# Patient Record
Sex: Female | Born: 1995
Health system: Southern US, Community
[De-identification: ages and names within clinical notes are randomized; demographics above are authoritative.]

---
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
## 2016-11-21 DIAGNOSIS — Z800 Family history of malignant neoplasm of digestive organs: Secondary | ICD-10-CM | POA: Diagnosis not present

## 2016-11-21 DIAGNOSIS — Z803 Family history of malignant neoplasm of breast: Secondary | ICD-10-CM | POA: Diagnosis not present

## 2016-12-31 DIAGNOSIS — M9903 Segmental and somatic dysfunction of lumbar region: Secondary | ICD-10-CM | POA: Diagnosis not present

## 2016-12-31 DIAGNOSIS — M9901 Segmental and somatic dysfunction of cervical region: Secondary | ICD-10-CM | POA: Diagnosis not present

## 2016-12-31 DIAGNOSIS — M9902 Segmental and somatic dysfunction of thoracic region: Secondary | ICD-10-CM | POA: Diagnosis not present

## 2016-12-31 DIAGNOSIS — M40202 Unspecified kyphosis, cervical region: Secondary | ICD-10-CM | POA: Diagnosis not present

## 2017-02-11 DIAGNOSIS — H5203 Hypermetropia, bilateral: Secondary | ICD-10-CM | POA: Diagnosis not present

## 2017-02-27 DIAGNOSIS — F332 Major depressive disorder, recurrent severe without psychotic features: Secondary | ICD-10-CM | POA: Diagnosis not present

## 2017-02-27 DIAGNOSIS — Z8639 Personal history of other endocrine, nutritional and metabolic disease: Secondary | ICD-10-CM | POA: Diagnosis not present

## 2017-02-27 DIAGNOSIS — R5383 Other fatigue: Secondary | ICD-10-CM | POA: Diagnosis not present

## 2017-06-30 DIAGNOSIS — L709 Acne, unspecified: Secondary | ICD-10-CM | POA: Diagnosis not present

## 2017-06-30 DIAGNOSIS — N644 Mastodynia: Secondary | ICD-10-CM | POA: Diagnosis not present

## 2017-06-30 DIAGNOSIS — N6029 Fibroadenosis of unspecified breast: Secondary | ICD-10-CM | POA: Diagnosis not present

## 2017-06-30 DIAGNOSIS — Z975 Presence of (intrauterine) contraceptive device: Secondary | ICD-10-CM | POA: Diagnosis not present

## 2017-08-21 DIAGNOSIS — D485 Neoplasm of uncertain behavior of skin: Secondary | ICD-10-CM | POA: Diagnosis not present

## 2017-08-21 MED FILL — MINOCYCLINE 100 MG CAPSULE: 100 | 30 days supply | Qty: 60 | Fill #0

## 2017-09-29 ENCOUNTER — Encounter: Payer: Self-pay | Admitting: Internal Medicine

## 2017-09-29 ENCOUNTER — Ambulatory Visit (INDEPENDENT_AMBULATORY_CARE_PROVIDER_SITE_OTHER): Payer: 59 | Admitting: Internal Medicine

## 2017-09-29 DIAGNOSIS — S60352A Superficial foreign body of left thumb, initial encounter: Secondary | ICD-10-CM | POA: Diagnosis not present

## 2017-09-29 DIAGNOSIS — Z23 Encounter for immunization: Secondary | ICD-10-CM | POA: Diagnosis not present

## 2017-09-29 NOTE — Progress Notes (Signed)
   Subjective:  Patient ID: Carly Young, female    DOB: 07-Jan-1996  Age: 22 y.o. 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  This occurred seconds ago here at work.  He complains of mild pain.  No outpatient medications prior to visit.   No facility-administered medications prior to visit.     ROS Review of Systems  All other systems reviewed and are negative.   Objective:  There were no vitals taken for this visit.  BP Readings from Last 3 Encounters:  No data found for BP    Wt Readings from Last 3 Encounters:  No data found for Wt    Physical Exam  Constitutional:  Tearful female  Musculoskeletal:       Left hand: She exhibits deformity. She exhibits normal range of motion, no tenderness, normal capillary refill, no laceration and no swelling. Normal sensation noted. Normal strength noted.       Hands:   No results found for: WBC, HGB, HCT, PLT, GLUCOSE, CHOL, TRIG, HDL, LDLDIRECT, LDLCALC, ALT, AST, NA, K, CL, CREATININE, BUN, CO2, TSH, PSA, INR, GLUF, HGBA1C, MICROALBUR  Dg Chest 2 View  Result Date: 08/05/2005 Clinical Data:  42-year-old, with fever for five days. 2-VIEW CHEST: Findings:  Cardiac silhouette, mediastinal, and hilar contours are within normal limits.  There is a vague opacity on the frontal film in the left lung which on the lateral film appears to be an infiltrate with some atelectasis in the left lingular region.  It is possible it could be loculated fluid in a fissure but I think that is much less likely particularly given her symptoms. IMPRESSION:  Left lingular infiltrate.  I would recommend a posttreatment followup chest film to make sure this resolves. Provider: Bryson Corona   Assessment & Plan:   Honestii was seen today for hand pain.  Diagnoses and all orders for this visit:  Foreign body of left thumb, initial encounter- The staple was easily and  thoroughly removed with no complications.  She will observe the area for signs of wound infection and if symptoms develop she will promptly let me know and I will start antibiotics but at this time I consider this to be a clean wound and I do not think phylactic antibiotics are warranted.  She was given a tetanus booster.  Other orders -     Tdap vaccine greater than or equal to 7yo IM   Carly Young does not currently have medications on file.  No orders of the defined types were placed in this encounter.    Follow-up: Return in about 1 day (around 09/30/2017).  Scarlette Calico, MD

## 2017-09-29 NOTE — Patient Instructions (Signed)
Hand or Foot Foreign Body, Adult  A foreign body is an object that gets into your body that should not be there. Your hands and feet are common entry points for foreign bodies. Common examples of foreign bodies include:   Splinters.   Thorns.   Pieces of glass, wood, metal, or plastic.   Metal objects such as needles, nails, and fishing hooks.    Foreign bodies that are not removed quickly may cause infection.  What are the causes?  A foreign body can get in your hand or foot through an injury, such as a scrape or cut, or if something punctures your skin.  What are the signs or symptoms?  Symptoms of having recently gotten a foreign body in your hand or foot include:   Pain.   Redness.   Swelling.   Tenderness.   Numbness or tingling.   Noticing something under the skin.    If the foreign body has caused an infection, symptoms may also include:   Fever.   Warmth in the area of the puncture.   A lump that you can feel under the skin.   Not being able to use your hand or put weight (bear weight) on your foot.   Greenish or yellow fluid coming from the puncture area.    How is this diagnosed?  Your health care provider will diagnose a foreign body in the hand or foot by taking a medical history and examining your wound. If the foreign body is deep, you may also have tests, such as an X-ray, MRI, or ultrasound.  In some cases, your health care provider may need to numb the area, open it up to examine what is inside your skin, and remove the object.  How is this treated?  Your health care provider may be able to remove the foreign body while exploring your wound during the diagnosis. If the foreign body is deep or in a wound that is infected, you may need to have a procedure to remove it.  If an incision is needed, it may be closed with stitches (sutures), skin glue, or adhesive strips. A bandage (dressing) will be placed over the incision.  You may also need to get a tetanus shot or take antibiotic  medicines to prevent or treat an infection.  Follow these instructions at home:  Medicines   Take over-the-counter and prescription medicines only as told by your health care provider.   If you were prescribed an antibiotic medicine, take the antibiotic as told by your health care provider. Do not stop taking the antibiotic even if you start to feel better.  Wound care   Change your dressing as told by your health care provider.   Follow instructions from your health care provider about how to take care of your wound or incision. Make sure you:  ? Wash your hands with soap and water before you change your dressing. If soap and water are not available, use hand sanitizer.  ? Change your dressing as told by your health care provider.  ? Leave sutures, skin glue, or adhesive strips in place. These skin closures may need to stay in place for 2 weeks or longer. If adhesive strip edges start to loosen and curl up, you may trim the loose edges. Do not remove adhesive strips completely unless your health care provider tells you to do that.   Check your puncture area or incision every day for signs of infection. Check for:  ? More redness,   swelling, or pain.  ? More fluid or blood.  ? Warmth.  ? Pus or a bad smell.  General instructions   Return to your normal activities as told by your health care provider. Ask your health care provider what activities are safe for you.   Do not take baths, swim, or use a hot tub until your health care provider approves. Ask your health care provider if you may take showers. You may only be allowed to take sponge baths.   Keep all follow-up visits as told by your health care provider. This is important.  How is this prevented?  To protect yourself:   Wear gloves when working with sharp objects.   Do not walk barefoot in areas where there may be sharp objects.   Wear thick-soled shoes or boots when walking in areas where there are sharp objects.    Contact a health care provider  if:   You have a foreign body that has not come out or been removed.   You have more redness, swelling, or pain around the puncture area or incision.   You have more fluid or blood coming from the puncture area or incision.   Your puncture area or incision feels warm to the touch.   You have pus or a bad smell coming from the puncture area or incision.   You have a fever.   You were given a tetanus shot, and you have swelling, severe pain, redness, or bleeding at the injection site.  Get help right away if:   Your puncture area becomes numb.   You cannot use your hand or foot.  Summary   A foreign body is an object that gets into your body that should not be there. Your hands and feet are common entry points for foreign bodies.   Your health care provider may be able to remove the foreign body while exploring your wound during the diagnosis. If the foreign body is deep or in a wound that is infected, you may need to have a procedure to remove it.   You may also need to get a tetanus shot or take antibiotic medicines to prevent or treat an infection.   Check your puncture area or incision every day for signs of infection.  This information is not intended to replace advice given to you by your health care provider. Make sure you discuss any questions you have with your health care provider.  Document Released: 10/30/2016 Document Revised: 10/30/2016 Document Reviewed: 10/30/2016  Elsevier Interactive Patient Education  2018 [employer]

## 2017-10-06 DIAGNOSIS — L709 Acne, unspecified: Secondary | ICD-10-CM | POA: Diagnosis not present

## 2017-11-12 ENCOUNTER — Other Ambulatory Visit (INDEPENDENT_AMBULATORY_CARE_PROVIDER_SITE_OTHER): Payer: 59

## 2017-11-12 ENCOUNTER — Other Ambulatory Visit: Payer: Self-pay | Admitting: Family

## 2017-11-12 DIAGNOSIS — N926 Irregular menstruation, unspecified: Secondary | ICD-10-CM

## 2017-11-12 LAB — HCG, QUANTITATIVE, PREGNANCY: Quantitative HCG: 0.14 m[IU]/mL

## 2017-12-11 DIAGNOSIS — Z0000 Encounter for general adult medical examination without abnormal findings: Secondary | ICD-10-CM | POA: Diagnosis not present

## 2017-12-11 DIAGNOSIS — Z6829 Body mass index (BMI) 29.0-29.9, adult: Secondary | ICD-10-CM | POA: Diagnosis not present

## 2018-03-10 NOTE — Progress Notes (Signed)
[name] [address], [place] [address] Phone: [phone] Subjective:    I'm seeing this patient by the request  of:    CC: Neck and back pain  [id number]  Carly Young is a 22 y.o. female coming in with complaint of back, neck, and bilateral hip pain.   Patient has been having pain between scapula. Has scoliosis and has done physical therapy. Patient has constant pain. Has tried ergonomic adjustments. Also has pain on the right cervical. Notes increased tightness as compared bilaterally. Denies any radiating symptoms.   Pain in the hip flexors. Is able to pop the joint to relieve her pain. Pain is intermittent. No specific pattern to her pain. Has a hard time getting comfortable at night and feels that this contributes to her hip pain. Denies any radiating symptoms. Has seen a chripractor for adjustments that did help.   Does try to stretch if she gets tight. Does note that it helps when she performs stretches. Is not currently performing any core exercises.   Patient brought in x-rays.  Patient's x-rays were independently visualized by me.  X-rays taking back in May 2018 shows the patient does have cervical ribs noted bilaterally.  Otherwise cervical neck x-rays unremarkable.  Lower back x-ray show the patient does have some mild narrowing between the L5-S1 but otherwise unremarkable.    No past medical history on file. No past surgical history on file. Social History   Socioeconomic History  . Marital status: Single    Spouse name: Not on file  . Number of children: Not on file  . Years of education: Not on file  . Highest education level: Not on file  Occupational History  . Not on file  Social Needs  . Financial resource strain: Not on file  . Food insecurity:    Worry: Not on file    Inability: Not on file  . Transportation needs:    Medical: Not on file    Non-medical: Not on file  Tobacco Use  . Smoking status:  Not on file  Substance and Sexual Activity  . Alcohol use: Not on file  . Drug use: Not on file  . Sexual activity: Not on file  Lifestyle  . Physical activity:    Days per week: Not on file    Minutes per session: Not on file  . Stress: Not on file  Relationships  . Social connections:    Talks on phone: Not on file    Gets together: Not on file    Attends religious service: Not on file    Active member of club or organization: Not on file    Attends meetings of clubs or organizations: Not on file    Relationship status: Not on file  Other Topics Concern  . Not on file  Social History Narrative  . Not on file   Not on File No family history on file.  No family history of autoimmune   Past medical history, social, surgical and family history all reviewed in electronic medical record.  No pertanent information unless stated regarding to the chief complaint.   Review of Systems:Review of systems updated and as accurate as of 03/12/18  No headache, visual changes, nausea, vomiting, diarrhea, constipation, dizziness, abdominal pain, skin rash, fevers, chills, night sweats, weight loss, swollen lymph nodes, body aches, joint swelling, chest pain, shortness of breath, mood changes.  Positive muscle aches  Objective  Blood pressure 110/80, pulse  63, height 5\' 7"  (1.702 m), SpO2 98 %. Systems examined below as of 03/12/18   General: No apparent distress alert and oriented x3 mood and affect normal, dressed appropriately.  HEENT: Pupils equal, extraocular movements intact  Respiratory: Patient's speak in full sentences and does not appear short of breath  Cardiovascular: No lower extremity edema, non tender, no erythema  Skin: Warm dry intact with no signs of infection or rash on extremities or on axial skeleton.  Abdomen: Soft nontender  Neuro: Cranial nerves II through XII are intact, neurovascularly intact in all extremities with 2+ DTRs and 2+ pulses.  Lymph: No lymphadenopathy  of posterior or anterior cervical chain or axillae bilaterally.  Gait normal with good balance and coordination.  MSK: Mild tender with full range of motion and good stability and symmetric strength and tone of shoulders, elbows, wrist, hip, knee and ankles bilaterally.  Neck: Inspection loss of lordosis. No palpable stepoffs. Negative Spurling's maneuver. Significant decrease in extension sidebending and rotation bilaterally.  Significant muscle tightness compared to what would be expected Grip strength and sensation normal in bilateral hands Strength good C4 to T1 distribution No sensory change to C4 to T1 Negative Hoffman sign bilaterally Reflexes normal Moderate severe tightness of the trapezius bilaterally  Back Exam:  Inspection: Loss of lordosis very minimal scoliosis Motion: Flexion 45 deg, Extension 25 deg, Side Bending to 35 deg bilaterally,  Rotation to 30 deg bilaterally  SLR laying: Negative  XSLR laying: Negative  Palpable tenderness: Tender to palpation the paraspinal musculature lumbar spine right greater than left. FABER: Tightness bilaterally. Sensory change: Gross sensation intact to all lumbar and sacral dermatomes.  Reflexes: 2+ at both patellar tendons, 2+ at achilles tendons, Babinski's downgoing.  Strength at foot  Plantar-flexion: 5/5 Dorsi-flexion: 5/5 Eversion: 5/5 Inversion: 5/5  Leg strength  Quad: 5/5 Hamstring: 5/5 Hip flexor: 5/5 Hip abductors: 5/5  Gait unremarkable.  Osteopathic findings C4 flexed rotated and side bent left C6 flexed rotated and side bent left T9 extended rotated and side bent left L2 flexed rotated and side bent right Sacrum right on right    Impression and Recommendations:     This case required medical decision making of moderate complexity.      Note: This dictation was prepared with Dragon dictation along with smaller phrase technology. Any transcriptional errors that result from this process are unintentional.

## 2018-03-12 ENCOUNTER — Ambulatory Visit (INDEPENDENT_AMBULATORY_CARE_PROVIDER_SITE_OTHER): Payer: 59 | Admitting: Family Medicine

## 2018-03-12 ENCOUNTER — Other Ambulatory Visit (INDEPENDENT_AMBULATORY_CARE_PROVIDER_SITE_OTHER): Payer: 59

## 2018-03-12 ENCOUNTER — Encounter: Payer: Self-pay | Admitting: Family Medicine

## 2018-03-12 VITALS — BP 110/80 | HR 63 | Ht 67.0 in

## 2018-03-12 DIAGNOSIS — M999 Biomechanical lesion, unspecified: Secondary | ICD-10-CM

## 2018-03-12 DIAGNOSIS — M2550 Pain in unspecified joint: Secondary | ICD-10-CM

## 2018-03-12 DIAGNOSIS — Z8739 Personal history of other diseases of the musculoskeletal system and connective tissue: Secondary | ICD-10-CM | POA: Diagnosis not present

## 2018-03-12 LAB — SEDIMENTATION RATE: SED RATE: 17 mm/h (ref 0–20)

## 2018-03-12 LAB — URIC ACID: URIC ACID, SERUM: 4.7 mg/dL (ref 2.4–7.0)

## 2018-03-12 LAB — TSH: TSH: 0.78 u[IU]/mL (ref 0.35–4.50)

## 2018-03-12 LAB — FERRITIN: FERRITIN: 10.7 ng/mL (ref 10.0–291.0)

## 2018-03-12 LAB — VITAMIN D 25 HYDROXY (VIT D DEFICIENCY, FRACTURES): VITD: 17.86 ng/mL — AB (ref 30.00–100.00)

## 2018-03-12 MED ORDER — VITAMIN D (ERGOCALCIFEROL) 1.25 MG (50000 UNIT) PO CAPS: 50000.0000 [IU] | ORAL_CAPSULE | ORAL | 0 refills | Status: AC

## 2018-03-12 MED FILL — VIT D2 1.25 MG (50,000 UNIT: 1.25 MG | 84 days supply | Qty: 12 | Fill #0

## 2018-03-12 NOTE — Assessment & Plan Note (Signed)
Patient has more pain to palpation than what would be anticipated.  Patient does have even some swelling.  Decreased range of motion of multiple joints.  Discussed with patient in great length and will get laboratory work-up.  Attempted osteopathic manipulation with varying degrees of success.  Discussed posture and ergonomics given home exercises for scapular dyskinesis.  Follow-up again in 4 to 8 weeks

## 2018-03-12 NOTE — Assessment & Plan Note (Signed)
Decision today to treat with OMT was based on Physical Exam  After verbal consent patient was treated with HVLA, ME, FPR techniques in cervical, thoracic, lumbar and sacral areas  Patient tolerated the procedure well with improvement in symptoms  Patient given exercises, stretches and lifestyle modifications  See medications in patient instructions if given  Patient will follow up in 4-8 weeks 

## 2018-03-12 NOTE — Patient Instructions (Addendum)
A little different Exercises 3 times a week.  Labs downstairs .[id number] Tennis ball between shoulder blades with sitting Once weekly vitamin D for 12 weeks Turmeric 500mg  daily  Choline 500mg  daily

## 2018-03-16 LAB — ANGIOTENSIN CONVERTING ENZYME: Angiotensin-Converting Enzyme: 41 U/L (ref 9–67)

## 2018-03-16 LAB — ANA: Anti Nuclear Antibody(ANA): NEGATIVE

## 2018-03-16 LAB — RHEUMATOID FACTOR: Rhuematoid fact SerPl-aCnc: <14 IU/mL (ref <14)

## 2018-04-23 ENCOUNTER — Ambulatory Visit (INDEPENDENT_AMBULATORY_CARE_PROVIDER_SITE_OTHER): Payer: 59 | Admitting: Family Medicine

## 2018-04-23 ENCOUNTER — Encounter: Payer: Self-pay | Admitting: Family Medicine

## 2018-04-23 VITALS — BP 100/70 | HR 70 | Ht 67.0 in

## 2018-04-23 DIAGNOSIS — M999 Biomechanical lesion, unspecified: Secondary | ICD-10-CM

## 2018-04-23 DIAGNOSIS — R51 Headache: Secondary | ICD-10-CM | POA: Diagnosis not present

## 2018-04-23 DIAGNOSIS — G4486 Cervicogenic headache: Secondary | ICD-10-CM

## 2018-04-23 NOTE — Patient Instructions (Signed)
Good to see you  Alvera Singh is your friend.  Keep it up! Stay active See me again in 12 weeks but you know what to do

## 2018-04-23 NOTE — Assessment & Plan Note (Signed)
Decision today to treat with OMT was based on Physical Exam  After verbal consent patient was treated with HVLA, ME, FPR techniques in cervical, thoracic, lumbar and sacral areas  Patient tolerated the procedure well with improvement in symptoms  Patient given exercises, stretches and lifestyle modifications  See medications in patient instructions if given  Patient will follow up in 6 weeks 

## 2018-04-23 NOTE — Progress Notes (Signed)
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   CC: Back pain follow-up  [id number]  Carly Young is a 22 y.o. female coming in with complaint of back pain. Pain is doing better. Has been taking meds prescribed. Massage recently and was told she has never damage in his neck.  Patient states that overall though doing some of the different exercises in the anti-inflammatories have been beneficial.  Noticing less pain in severity overall.  Still notices tightness in the neck causing headaches.     No past medical history on file. No past surgical history on file. Social History   Socioeconomic History  . Marital status: Single    Spouse name: Not on file  . Number of children: Not on file  . Years of education: Not on file  . Highest education level: Not on file  Occupational History  . Not on file  Social Needs  . Financial resource strain: Not on file  . Food insecurity:    Worry: Not on file    Inability: Not on file  . Transportation needs:    Medical: Not on file    Non-medical: Not on file  Tobacco Use  . Smoking status: Not on file  Substance and Sexual Activity  . Alcohol use: Not on file  . Drug use: Not on file  . Sexual activity: Not on file  Lifestyle  . Physical activity:    Days per week: Not on file    Minutes per session: Not on file  . Stress: Not on file  Relationships  . Social connections:    Talks on phone: Not on file    Gets together: Not on file    Attends religious service: Not on file    Active member of club or organization: Not on file    Attends meetings of clubs or organizations: Not on file    Relationship status: Not on file  Other Topics Concern  . Not on file  Social History Narrative  . Not on file   Not on File No family history on file.       Current Outpatient  Medications (Other):  Marland Kitchen  Vitamin D, Ergocalciferol, (DRISDOL) 50000 units CAPS capsule, Take 1 capsule (50,000 Units total) by mouth every 7 (seven) days.    Past medical history, social, surgical and family history all reviewed in electronic medical record.  No pertanent information unless stated regarding to the chief complaint.   Review of Systems:  No headache, visual changes, nausea, vomiting, diarrhea, constipation, dizziness, abdominal pain, skin rash, fevers, chills, night sweats, weight loss, swollen lymph nodes, body aches, joint swelling, chest pain, shortness of breath, mood changes.  Positive muscle aches  Objective  Blood pressure 100/70, pulse 70, height 5\' 7"  (1.702 m), SpO2 98 %.    General: No apparent distress alert and oriented x3 mood and affect normal, dressed appropriately.  HEENT: Pupils equal, extraocular movements intact  Respiratory: Patient's speak in full sentences and does not appear short of breath  Cardiovascular: No lower extremity edema, non tender, no erythema  Skin: Warm dry intact with no signs of infection or rash on extremities or on axial skeleton.  Abdomen: Soft nontender  Neuro: Cranial nerves II through XII are intact, neurovascularly intact in all extremities with 2+ DTRs and 2+ pulses.  Lymph: No lymphadenopathy  of posterior or anterior cervical chain or axillae bilaterally.  Gait normal with good balance and coordination.  MSK:  Non tender with full range of motion and good stability and symmetric strength and tone of shoulders, elbows, wrist, hip, knee and ankles bilaterally.  Neck: Inspection loss of lordosis. No palpable stepoffs. Negative Spurling's maneuver. Mild limitation in extension Grip strength and sensation normal in bilateral hands Strength good C4 to T1 distribution No sensory change to C4 to T1 Negative Hoffman sign bilaterally Reflexes normal  Back Exam:  Inspection: Loss of lordosis Motion: Flexion 45 deg, Extension  25 deg, Side Bending to 45 deg bilaterally, Rotation to 35 deg bilaterally  SLR laying: Negative  XSLR laying: Negative  Palpable tenderness: Tender to palpation in the paraspinal musculature. FABER: Tightness. Sensory change: Gross sensation intact to all lumbar and sacral dermatomes.  Reflexes: 2+ at both patellar tendons, 2+ at achilles tendons, Babinski's downgoing.  Strength at foot  Plantar-flexion: 5/5 Dorsi-flexion: 5/5 Eversion: 5/5 Inversion: 5/5  Leg strength  Quad: 5/5 Hamstring: 5/5 Hip flexor: 5/5 Hip abductors: 4/5 but symmetric Gait unremarkable.   Osteopathic findings C2 flexed rotated and side bent right C6 flexed rotated and side bent left T3 extended rotated and side bent right inhaled third rib T9 extended rotated and side bent left L2 flexed rotated and side bent right Sacrum right on right    Impression and Recommendations:     This case required medical decision making of moderate complexity. The above documentation has been reviewed and is accurate and complete Lyndal Pulley, DO       Note: This dictation was prepared with Dragon dictation along with smaller phrase technology. Any transcriptional errors that result from this process are unintentional.

## 2018-04-23 NOTE — Assessment & Plan Note (Signed)
Patient does have some discomfort with a headache.  We discussed ergonomics and home exercises.  We discussed posture and stability.  Follow-up again in 6 weeks

## 2018-04-24 DIAGNOSIS — Z975 Presence of (intrauterine) contraceptive device: Secondary | ICD-10-CM | POA: Diagnosis not present

## 2018-04-24 DIAGNOSIS — Z01419 Encounter for gynecological examination (general) (routine) without abnormal findings: Secondary | ICD-10-CM | POA: Diagnosis not present

## 2018-04-24 DIAGNOSIS — R87612 Low grade squamous intraepithelial lesion on cytologic smear of cervix (LGSIL): Secondary | ICD-10-CM | POA: Diagnosis not present

## 2018-04-24 DIAGNOSIS — Z124 Encounter for screening for malignant neoplasm of cervix: Secondary | ICD-10-CM | POA: Diagnosis not present

## 2018-06-17 MED FILL — CEPHALEXIN 500 MG CAPSULE: 500 | 7 days supply | Qty: 28 | Fill #0

## 2018-06-24 MED FILL — IBUPROFEN 600 MG TABLET: 600 | 5 days supply | Qty: 20 | Fill #0

## 2018-07-15 ENCOUNTER — Ambulatory Visit: Payer: 59 | Admitting: Family Medicine

## 2018-09-17 DIAGNOSIS — B070 Plantar wart: Secondary | ICD-10-CM | POA: Diagnosis not present

## 2018-10-13 ENCOUNTER — Ambulatory Visit (INDEPENDENT_AMBULATORY_CARE_PROVIDER_SITE_OTHER): Payer: 59 | Admitting: Family

## 2018-10-13 ENCOUNTER — Encounter: Payer: Self-pay | Admitting: Family

## 2018-10-13 VITALS — BP 110/78 | HR 103 | Temp 97.8°F | Ht 67.0 in | Wt 192.0 lb

## 2018-10-13 DIAGNOSIS — B9789 Other viral agents as the cause of diseases classified elsewhere: Secondary | ICD-10-CM

## 2018-10-13 DIAGNOSIS — J069 Acute upper respiratory infection, unspecified: Secondary | ICD-10-CM

## 2018-10-13 MED ORDER — FLUTICASONE PROPIONATE 50 MCG/ACT NA SUSP: 2.0000 | Freq: Every day | NASAL | 6 refills | Status: AC

## 2018-10-13 MED ORDER — BENZONATATE 100 MG PO CAPS: 100.0000 mg | ORAL_CAPSULE | Freq: Three times a day (TID) | ORAL | 0 refills | Status: AC | PRN

## 2018-10-13 MED ORDER — AZITHROMYCIN 250 MG PO TABS: ORAL_TABLET | ORAL | 0 refills | Status: AC

## 2018-10-13 MED FILL — BENZONATATE 100 MG CAP: 100 | 6 days supply | Qty: 20 | Fill #0

## 2018-10-13 MED FILL — FLUTICASONE PROP 50 MCG SPR: 50 | 30 days supply | Qty: 16 | Fill #0

## 2018-10-13 NOTE — Progress Notes (Signed)
CHASTIDY RANKER is a 23 y.o. female with the following history as recorded in [employer]:  Patient Active Problem List   Diagnosis Date Noted  . Cervicogenic headache 04/23/2018  . Polyarthralgia 03/12/2018  . Nonallopathic lesion of thoracic region 03/12/2018  . Nonallopathic lesion of cervical region 03/12/2018  . Nonallopathic lesion of lumbosacral region 03/12/2018  . Foreign body of thumb, left 09/29/2017    Current Outpatient Medications  Medication Sig Dispense Refill  . etonogestrel (NEXPLANON) 68 MG IMPL implant Inject into the skin.    Marland Kitchen ibuprofen (ADVIL,MOTRIN) 600 MG tablet     . Turmeric 500 MG CAPS Take by mouth.    . Vitamin D, Ergocalciferol, (DRISDOL) 50000 units CAPS capsule Take 1 capsule (50,000 Units total) by mouth every 7 (seven) days. 12 capsule 0  . azithromycin (ZITHROMAX) 250 MG tablet 2 tabs po qd x 1 day; 1 tablet per day x 4 days; 6 tablet 0  . benzonatate (TESSALON) 100 MG capsule Take 1 capsule (100 mg total) by mouth 3 (three) times daily as needed. 20 capsule 0  . fluticasone (FLONASE) 50 MCG/ACT nasal spray Place 2 sprays into both nostrils daily. 16 g 6   No current facility-administered medications for this visit.     Allergies: Patient has no allergy information on record.  History reviewed. No pertinent past medical history.  History reviewed. No pertinent surgical history.  History reviewed. No pertinent family history.  Social History   Tobacco Use  . Smoking status: Not on file  Substance Use Topics  . Alcohol use: Not on file    Subjective:  Cold symptoms x 4-5 days; no fever, has used OTC Chloraseptic;   + head congestion; throat feels irritated; no night sweats; no chest pain, no shortness of breath;  Took one tablet of cough/cold medication last night with some relief;                                 Objective:  Vitals:   10/13/18 0923  BP: 110/78  Pulse: (!) 103  Temp: 97.8 F (36.6 C)  TempSrc: Oral  SpO2: 97%   Weight: 192 lb 0.6 oz (87.1 kg)  Height: 5\' 7"  (1.702 m)    General: Well developed, well nourished, in no acute distress  Skin : Warm and dry.  Head: Normocephalic and atraumatic  Eyes: Sclera and conjunctiva clear; pupils round and reactive to light; extraocular movements intact  Ears: External normal; canals clear; tympanic membranes normal  Oropharynx: Pink, supple. No suspicious lesions  Neck: Supple without thyromegaly, adenopathy  Lungs: Respirations unlabored; clear to auscultation bilaterally without wheeze, rales, rhonchi  CVS exam: normal rate and regular rhythm.  Neurologic: Alert and oriented; speech intact; face symmetrical; moves all extremities well; CNII-XII intact without focal deficit   Assessment:  1. Viral URI with cough     Plan:  Reassurance; symptomatic treatment discussed; trial of Flonase and Tessalon Perles; increase fluids, rest; Rx for Z-pak #1- hold and fill if no improvement in symptoms in 48 hours; follow-up worse, no better.   No follow-ups on file.  No orders of the defined types were placed in this encounter.   Requested Prescriptions   Signed Prescriptions Disp Refills  . fluticasone (FLONASE) 50 MCG/ACT nasal spray 16 g 6    Sig: Place 2 sprays into both nostrils daily.  . benzonatate (TESSALON) 100 MG capsule 20 capsule 0    Sig:  Take 1 capsule (100 mg total) by mouth 3 (three) times daily as needed.  Marland Kitchen azithromycin (ZITHROMAX) 250 MG tablet 6 tablet 0    Sig: 2 tabs po qd x 1 day; 1 tablet per day x 4 days;

## 2018-10-13 NOTE — Patient Instructions (Signed)
Viral Respiratory Infection  A viral respiratory infection is an illness that affects parts of the body that are used for breathing. These include the lungs, nose, and throat. It is caused by a germ called a virus.  Some examples of this kind of infection are:  · A cold.  · The flu (influenza).  · A respiratory syncytial virus (RSV) infection.  A person who gets this illness may have the following symptoms:  · A stuffy or runny nose.  · Yellow or green fluid in the nose.  · A cough.  · Sneezing.  · Tiredness (fatigue).  · Achy muscles.  · A sore throat.  · Sweating or chills.  · A fever.  · A headache.  Follow these instructions at home:  Managing pain and congestion  · Take over-the-counter and prescription medicines only as told by your doctor.  · If you have a sore throat, gargle with salt water. Do this 3-4 times per day or as needed. To make a salt-water mixture, dissolve ½-1 tsp of salt in 1 cup of warm water. Make sure that all the salt dissolves.  · Use nose drops made from salt water. This helps with stuffiness (congestion). It also helps soften the skin around your nose.  · Drink enough fluid to keep your pee (urine) pale yellow.  General instructions    · Rest as much as possible.  · Do not drink alcohol.  · Do not use any products that have nicotine or tobacco, such as cigarettes and e-cigarettes. If you need help quitting, ask your doctor.  · Keep all follow-up visits as told by your doctor. This is important.  How is this prevented?    · Get a flu shot every year. Ask your doctor when you should get your flu shot.  · Do not let other people get your germs. If you are sick:  ? Stay home from work or school.  ? Wash your hands with soap and water often. Wash your hands after you cough or sneeze. If soap and water are not available, use hand sanitizer.  · Avoid contact with people who are sick during cold and flu season. This is in fall and winter.  Get help if:  · Your symptoms last for 10 days or  longer.  · Your symptoms get worse over time.  · You have a fever.  · You have very bad pain in your face or forehead.  · Parts of your jaw or neck become very swollen.  Get help right away if:  · You feel pain or pressure in your chest.  · You have shortness of breath.  · You faint or feel like you will faint.  · You keep throwing up (vomiting).  · You feel confused.  Summary  · A viral respiratory infection is an illness that affects parts of the body that are used for breathing.  · Examples of this illness include a cold, the flu, and respiratory syncytial virus (RSV) infection.  · The infection can cause a runny nose, cough, sneezing, sore throat, and fever.  · Follow what your doctor tells you about taking medicines, drinking lots of fluid, washing your hands, resting at home, and avoiding people who are sick.  This information is not intended to replace advice given to you by your health care provider. Make sure you discuss any questions you have with your health care provider.  Document Released: 07/18/2008 Document Revised: 09/15/2017 Document Reviewed: 09/15/2017  [employer]   Interactive Patient Education © 2019 [employer]

## 2019-03-15 DIAGNOSIS — Z0000 Encounter for general adult medical examination without abnormal findings: Secondary | ICD-10-CM | POA: Diagnosis not present

## 2019-03-15 DIAGNOSIS — R41840 Attention and concentration deficit: Secondary | ICD-10-CM | POA: Diagnosis not present

## 2019-03-15 DIAGNOSIS — Z803 Family history of malignant neoplasm of breast: Secondary | ICD-10-CM | POA: Diagnosis not present

## 2019-03-15 DIAGNOSIS — Z6832 Body mass index (BMI) 32.0-32.9, adult: Secondary | ICD-10-CM | POA: Diagnosis not present

## 2019-03-15 DIAGNOSIS — E559 Vitamin D deficiency, unspecified: Secondary | ICD-10-CM | POA: Diagnosis not present

## 2019-03-15 DIAGNOSIS — D229 Melanocytic nevi, unspecified: Secondary | ICD-10-CM | POA: Diagnosis not present

## 2019-03-15 DIAGNOSIS — Z975 Presence of (intrauterine) contraceptive device: Secondary | ICD-10-CM | POA: Diagnosis not present

## 2019-03-18 MED FILL — VIT D2 1.25 MG (50,000 UNIT: 1.25 MG | 84 days supply | Qty: 12 | Fill #0

## 2019-03-29 DIAGNOSIS — D485 Neoplasm of uncertain behavior of skin: Secondary | ICD-10-CM | POA: Diagnosis not present

## 2019-03-29 DIAGNOSIS — L700 Acne vulgaris: Secondary | ICD-10-CM | POA: Diagnosis not present

## 2019-04-27 DIAGNOSIS — Z124 Encounter for screening for malignant neoplasm of cervix: Secondary | ICD-10-CM | POA: Diagnosis not present

## 2019-04-27 DIAGNOSIS — Z975 Presence of (intrauterine) contraceptive device: Secondary | ICD-10-CM | POA: Diagnosis not present

## 2019-04-27 DIAGNOSIS — Z01419 Encounter for gynecological examination (general) (routine) without abnormal findings: Secondary | ICD-10-CM | POA: Diagnosis not present

## 2019-04-28 DIAGNOSIS — E785 Hyperlipidemia, unspecified: Secondary | ICD-10-CM | POA: Diagnosis not present

## 2019-04-28 DIAGNOSIS — L700 Acne vulgaris: Secondary | ICD-10-CM | POA: Diagnosis not present

## 2019-04-29 DIAGNOSIS — L700 Acne vulgaris: Secondary | ICD-10-CM | POA: Diagnosis not present

## 2019-06-07 DIAGNOSIS — L700 Acne vulgaris: Secondary | ICD-10-CM | POA: Diagnosis not present

## 2019-06-30 DIAGNOSIS — Z20828 Contact with and (suspected) exposure to other viral communicable diseases: Secondary | ICD-10-CM | POA: Diagnosis not present

## 2019-06-30 DIAGNOSIS — R0602 Shortness of breath: Secondary | ICD-10-CM | POA: Diagnosis not present

## 2019-06-30 DIAGNOSIS — R519 Headache, unspecified: Secondary | ICD-10-CM | POA: Diagnosis not present
